# Patient Record
Sex: Female | Born: 1970 | Race: Asian | Hispanic: No | Marital: Married | Smoking: Never smoker
Health system: Southern US, Community
[De-identification: ages and names within clinical notes are randomized; demographics above are authoritative.]

## PROBLEM LIST (undated history)

## (undated) HISTORY — PX: APPENDECTOMY: SHX54

## (undated) HISTORY — PX: WISDOM TOOTH EXTRACTION: SHX21

---

## 2019-12-28 ENCOUNTER — Other Ambulatory Visit (HOSPITAL_COMMUNITY): Payer: Self-pay | Admitting: Internal Medicine

## 2019-12-28 DIAGNOSIS — Z1231 Encounter for screening mammogram for malignant neoplasm of breast: Secondary | ICD-10-CM

## 2019-12-30 ENCOUNTER — Encounter: Payer: Self-pay | Admitting: Internal Medicine

## 2019-12-31 ENCOUNTER — Other Ambulatory Visit: Payer: Self-pay

## 2019-12-31 ENCOUNTER — Ambulatory Visit (HOSPITAL_COMMUNITY)
Admission: RE | Admit: 2019-12-31 | Discharge: 2019-12-31 | Disposition: A | Discharge status: Home / Self Care | Payer: BC Managed Care – PPO | Source: Ambulatory Visit | Attending: Internal Medicine | Admitting: Internal Medicine

## 2019-12-31 DIAGNOSIS — Z1231 Encounter for screening mammogram for malignant neoplasm of breast: Secondary | ICD-10-CM | POA: Insufficient documentation

## 2020-01-05 ENCOUNTER — Other Ambulatory Visit (HOSPITAL_COMMUNITY): Payer: Self-pay | Admitting: Internal Medicine

## 2020-01-05 ENCOUNTER — Other Ambulatory Visit: Payer: Self-pay | Admitting: Internal Medicine

## 2020-01-05 DIAGNOSIS — R1900 Intra-abdominal and pelvic swelling, mass and lump, unspecified site: Secondary | ICD-10-CM

## 2020-01-11 ENCOUNTER — Ambulatory Visit (HOSPITAL_COMMUNITY)
Admission: RE | Admit: 2020-01-11 | Discharge: 2020-01-11 | Disposition: A | Discharge status: Home / Self Care | Payer: BC Managed Care – PPO | Source: Ambulatory Visit | Attending: Internal Medicine | Admitting: Internal Medicine

## 2020-01-11 ENCOUNTER — Other Ambulatory Visit: Payer: Self-pay

## 2020-01-11 DIAGNOSIS — R1900 Intra-abdominal and pelvic swelling, mass and lump, unspecified site: Secondary | ICD-10-CM

## 2020-02-17 ENCOUNTER — Other Ambulatory Visit: Payer: Self-pay

## 2020-02-17 ENCOUNTER — Ambulatory Visit (INDEPENDENT_AMBULATORY_CARE_PROVIDER_SITE_OTHER): Payer: BC Managed Care – PPO | Admitting: *Deleted

## 2020-02-17 VITALS — Ht 60.0 in | Wt 113.2 lb

## 2020-02-17 DIAGNOSIS — Z1211 Encounter for screening for malignant neoplasm of colon: Secondary | ICD-10-CM

## 2020-02-17 MED ORDER — NA SULFATE-K SULFATE-MG SULF 17.5-3.13-1.6 GM/177ML PO SOLN: 1.0000 | Freq: Once | ORAL | 0 refills | Status: AC

## 2020-02-17 NOTE — Progress Notes (Addendum)
Gastroenterology Pre-Procedure Review  Request Date: 02/17/2020 Requesting Physician: Dr. Margo Aye, no previous TCS  PATIENT REVIEW QUESTIONS: The patient responded to the following health history questions as indicated:    1. Diabetes Melitis: no 2. Joint replacements in the past 12 months: no 3. Major health problems in the past 3 months: no 4. Has an artificial valve or MVP: no 5. Has a defibrillator: no 6. Has been advised in past to take antibiotics in advance of a procedure like teeth cleaning: no 7. Family history of colon cancer: no  8. Alcohol Use: no 9. Illicit drug Use: no 10. History of sleep apnea: no  11. History of coronary artery or other vascular stents placed within the last 12 months: no 12. History of any prior anesthesia complications: no 13. Body mass index is 22.11 kg/m.    MEDICATIONS & ALLERGIES:    Patient reports the following regarding taking any blood thinners:   Plavix? no Aspirin? no Coumadin? no Brilinta? no Xarelto? no Eliquis? no Pradaxa? no Savaysa? no Effient? no  Patient confirms/reports the following medications:  No current outpatient medications on file.   No current facility-administered medications for this visit.    Patient confirms/reports the following allergies:  No Known Allergies  No orders of the defined types were placed in this encounter.   AUTHORIZATION INFORMATION Primary Insurance: BCBS,  ID #:[id number] ,  Group #:[id number] Pre-Cert / Auth required: No, file to local [employer]  SCHEDULE INFORMATION: Procedure has been scheduled as follows:  Date: 03/10/2020, Time: 12:15  Location: [facility] with Dr. Marletta Lor  This Gastroenterology Pre-Precedure Review Form is being routed to the following provider(s): Ermalinda Memos, PA

## 2020-02-17 NOTE — Patient Instructions (Signed)
Kara Suarez  10-14-1970 MRN: [id number]     Procedure Date: 03/10/2020 Time to register: 10:45 Place to register: [facility] Procedure Time: 12:15 pm Scheduled provider: Dr. Abbey Chatters    PREPARATION FOR COLONOSCOPY WITH SUPREP BOWEL PREP KIT  Note: Suprep Bowel Prep Kit is a split-dose (2day) regimen. Consumption of BOTH 6-ounce bottles is required for a complete prep.  Please notify us immediately if you are diabetic, take iron supplements, or if you are on Coumadin or any other blood thinners.  Please hold the following medications: n/a                                                                                                                                                  2 DAYS BEFORE PROCEDURE:  DATE: 03/08/2020   DAY: Wednesday Begin clear liquid diet AFTER your lunch meal. NO SOLID FOODS after this point.  1 DAY BEFORE PROCEDURE:  DATE: 03/09/2020   DAY: Thursday Continue clear liquids the entire day - NO SOLID FOOD.   Diabetic medications adjustments for today: n/a  At 6:00pm: Complete steps 1 through 4 below, using ONE (1) 6-ounce bottle, before going to bed. Step 1:  Pour ONE (1) 6-ounce bottle of SUPREP liquid into the mixing container.  Step 2:  Add cool drinking water to the 16 ounce line on the container and mix.  Note: Dilute the solution concentrate as directed prior to use. Step 3:  DRINK ALL the liquid in the container. Step 4:  You MUST drink an additional two (2) or more 16 ounce containers of water over the next one (1) hour.   Continue clear liquids.  DAY OF PROCEDURE:   DATE: 03/10/2020   DAY: Friday If you take medications for your heart, blood pressure, or breathing, you may take these medications.  Diabetic medications adjustments for today: n/a  5 hours before your procedure at : 7:15 am Step 1:  Pour ONE (1) 6-ounce bottle of SUPREP liquid into the mixing container.  Step 2:  Add cool drinking water to the 16 ounce line on the  container and mix.  Note: Dilute the solution concentrate as directed prior to use. Step 3:  DRINK ALL the liquid in the container. Step 4:  You MUST drink an additional two (2) or more 16 ounce containers of water over the next one (1) hour. You MUST complete the final glass of water at least 3 hours before your colonoscopy. Nothing by mouth past 9:15 am.  You may take your morning medications with sip of water unless we have instructed otherwise.    Please see below for Dietary Information.  CLEAR LIQUIDS INCLUDE:  Water Jello (NOT red in color)   Ice Popsicles (NOT red in color)   Tea (sugar ok, no milk/cream) Powdered fruit flavored drinks  Coffee (sugar ok, no milk/cream)  Gatorade/ Lemonade/ Kool-Aid  (NOT red in color)   Juice: apple, white grape, white cranberry Soft drinks  Clear bullion, consomme, broth (fat free beef/chicken/vegetable)  Carbonated beverages (any kind)  Strained chicken noodle soup Hard Candy   Remember: Clear liquids are liquids that will allow you to see your fingers on the other side of a clear glass. Be sure liquids are NOT red in color, and not cloudy, but CLEAR.  DO NOT EAT OR DRINK ANY OF THE FOLLOWING:  Dairy products of any kind   Cranberry juice Tomato juice / V8 juice   Grapefruit juice Orange juice     Red grape juice  Do not eat any solid foods, including such foods as: cereal, oatmeal, yogurt, fruits, vegetables, creamed soups, eggs, bread, crackers, pureed foods in a blender, etc.   HELPFUL HINTS FOR DRINKING PREP SOLUTION:   Make sure prep is extremely cold. Mix and refrigerate the the morning of the prep. You may also put in the freezer.   You may try mixing some Crystal Light or Country Time Lemonade if you prefer. Mix in small amounts; add more if necessary.  Try drinking through a straw  Rinse mouth with water or a mouthwash between glasses, to remove after-taste.  Try sipping on a cold beverage /ice/ popsicles between glasses of  prep.  Place a piece of sugar-free hard candy in mouth between glasses.  If you become nauseated, try consuming smaller amounts, or stretch out the time between glasses. Stop for 30-60 minutes, then slowly start back drinking.     OTHER INSTRUCTIONS  You will need a responsible adult at least 50 years of age to accompany you and drive you home. This person must remain in the waiting room during your procedure. The hospital will cancel your procedure if you do not have a responsible adult with you.   1. Wear loose fitting clothing that is easily removed. 2. Leave jewelry and other valuables at home.  3. Remove all body piercing jewelry and leave at home. 4. Total time from sign-in until discharge is approximately 2-3 hours. 5. You should go home directly after your procedure and rest. You can resume normal activities the day after your procedure. 6. The day of your procedure you should not:  Drive  Make legal decisions  Operate machinery  Drink alcohol  Return to work   You may call the office (Dept: [phone]) before 5:00pm, or page the doctor on call [phone]) after 5:00pm, for further instructions, if necessary.   Insurance Information YOU WILL NEED TO CHECK WITH YOUR INSURANCE COMPANY FOR THE BENEFITS OF COVERAGE YOU HAVE FOR THIS PROCEDURE.  UNFORTUNATELY, NOT ALL INSURANCE COMPANIES HAVE BENEFITS TO COVER ALL OR PART OF THESE TYPES OF PROCEDURES.  IT IS YOUR RESPONSIBILITY TO CHECK YOUR BENEFITS, HOWEVER, WE WILL BE GLAD TO ASSIST YOU WITH ANY CODES YOUR INSURANCE COMPANY MAY NEED.    PLEASE NOTE THAT MOST INSURANCE COMPANIES WILL NOT COVER A SCREENING COLONOSCOPY FOR PEOPLE UNDER THE AGE OF 50  IF YOU HAVE BCBS INSURANCE, YOU MAY HAVE BENEFITS FOR A SCREENING COLONOSCOPY BUT IF POLYPS ARE FOUND THE DIAGNOSIS WILL CHANGE AND THEN YOU MAY HAVE A DEDUCTIBLE THAT WILL NEED TO BE MET. SO PLEASE MAKE SURE YOU CHECK YOUR BENEFITS FOR A SCREENING COLONOSCOPY AS WELL AS A  DIAGNOSTIC COLONOSCOPY.

## 2020-02-18 ENCOUNTER — Telehealth: Payer: Self-pay | Admitting: *Deleted

## 2020-02-18 NOTE — Telephone Encounter (Signed)
Pt wants to reschedule her procedure. [phone]

## 2020-02-18 NOTE — Progress Notes (Signed)
Ok to schedule.  ASA I/II 

## 2020-02-23 NOTE — Telephone Encounter (Signed)
Spoke with pt and she said that she could keep her regularly scheduled procedure as planned.

## 2020-03-06 ENCOUNTER — Encounter (HOSPITAL_COMMUNITY): Payer: Self-pay | Admitting: *Deleted

## 2020-03-08 ENCOUNTER — Other Ambulatory Visit (HOSPITAL_COMMUNITY)
Admission: RE | Admit: 2020-03-08 | Discharge: 2020-03-08 | Disposition: A | Discharge status: Home / Self Care | Payer: BC Managed Care – PPO | Source: Ambulatory Visit | Attending: Internal Medicine | Admitting: Internal Medicine

## 2020-03-08 ENCOUNTER — Other Ambulatory Visit: Payer: Self-pay

## 2020-03-08 DIAGNOSIS — K648 Other hemorrhoids: Secondary | ICD-10-CM | POA: Diagnosis not present

## 2020-03-08 DIAGNOSIS — Z01812 Encounter for preprocedural laboratory examination: Secondary | ICD-10-CM | POA: Insufficient documentation

## 2020-03-08 DIAGNOSIS — Z1211 Encounter for screening for malignant neoplasm of colon: Secondary | ICD-10-CM | POA: Diagnosis present

## 2020-03-08 DIAGNOSIS — Z20822 Contact with and (suspected) exposure to covid-19: Secondary | ICD-10-CM | POA: Diagnosis not present

## 2020-03-08 LAB — PREGNANCY, URINE: Preg Test, Ur: NEGATIVE

## 2020-03-09 LAB — SARS CORONAVIRUS 2 (TAT 6-24 HRS): SARS Coronavirus 2: NEGATIVE

## 2020-03-10 ENCOUNTER — Ambulatory Visit (HOSPITAL_COMMUNITY): Payer: BC Managed Care – PPO | Admitting: Anesthesiology

## 2020-03-10 ENCOUNTER — Ambulatory Visit (HOSPITAL_COMMUNITY)
Admission: RE | Admit: 2020-03-10 | Discharge: 2020-03-10 | Disposition: A | Discharge status: Home / Self Care | Payer: BC Managed Care – PPO | Attending: Internal Medicine | Admitting: Internal Medicine

## 2020-03-10 ENCOUNTER — Encounter (HOSPITAL_COMMUNITY): Payer: Self-pay

## 2020-03-10 ENCOUNTER — Other Ambulatory Visit: Payer: Self-pay

## 2020-03-10 ENCOUNTER — Encounter (HOSPITAL_COMMUNITY)
Admission: RE | Disposition: A | Discharge status: Home / Self Care | Payer: Self-pay | Source: Home / Self Care | Attending: Internal Medicine

## 2020-03-10 DIAGNOSIS — Z1211 Encounter for screening for malignant neoplasm of colon: Secondary | ICD-10-CM | POA: Diagnosis not present

## 2020-03-10 DIAGNOSIS — Z20822 Contact with and (suspected) exposure to covid-19: Secondary | ICD-10-CM | POA: Insufficient documentation

## 2020-03-10 DIAGNOSIS — K648 Other hemorrhoids: Secondary | ICD-10-CM | POA: Insufficient documentation

## 2020-03-10 HISTORY — PX: COLONOSCOPY WITH PROPOFOL: SHX5780

## 2020-03-10 SURGERY — COLONOSCOPY WITH PROPOFOL
Anesthesia: General

## 2020-03-10 MED ORDER — CHLORHEXIDINE GLUCONATE CLOTH 2 % EX PADS: 6.0000 | MEDICATED_PAD | Freq: Once | CUTANEOUS | Status: DC

## 2020-03-10 MED ORDER — STERILE WATER FOR IRRIGATION IR SOLN
Status: DC | PRN
  Administered 2020-03-10: 100 mL

## 2020-03-10 MED ORDER — PROPOFOL 10 MG/ML IV BOLUS
INTRAVENOUS | Status: DC | PRN
  Administered 2020-03-10: 80 mg via INTRAVENOUS
  Administered 2020-03-10: 150 ug/kg/min via INTRAVENOUS

## 2020-03-10 MED ORDER — LACTATED RINGERS IV SOLN: INTRAVENOUS | Status: DC | PRN

## 2020-03-10 MED ORDER — LACTATED RINGERS IV SOLN: Freq: Once | INTRAVENOUS | Status: AC

## 2020-03-10 NOTE — Anesthesia Postprocedure Evaluation (Signed)
Anesthesia Post Note  Patient: Kara Suarez  Procedure(s) Performed: COLONOSCOPY WITH PROPOFOL (N/A )  Patient location during evaluation: Endoscopy Anesthesia Type: General Level of consciousness: awake and alert and oriented Pain management: pain level controlled Respiratory status: spontaneous breathing, nonlabored ventilation and respiratory function stable Cardiovascular status: blood pressure returned to baseline and stable Postop Assessment: no apparent nausea or vomiting Anesthetic complications: no   No complications documented.   Last Vitals:  Vitals:   03/10/20 1152 03/10/20 1157  BP: 91/69 90/62  Pulse: 92 75  Resp: (!) 24 20  Temp: 36.5 C   SpO2: 98% 99%    Last Pain:  Vitals:   03/10/20 1202  TempSrc:   PainSc: 0-No pain                 Julian Reil

## 2020-03-10 NOTE — Transfer of Care (Signed)
Immediate Anesthesia Transfer of Care Note  Patient: Mita Ozaki  Procedure(s) Performed: COLONOSCOPY WITH PROPOFOL (N/A )  Patient Location: PACU  Anesthesia Type:General  Level of Consciousness: awake, alert , oriented and patient cooperative  Airway & Oxygen Therapy: Patient Spontanous Breathing  Post-op Assessment: Report given to RN, Post -op Vital signs reviewed and stable and Patient moving all extremities  Post vital signs: Reviewed and stable  Last Vitals:  Vitals Value Taken Time  BP    Temp    Pulse    Resp    SpO2      Last Pain:  Vitals:   03/10/20 1134  TempSrc:   PainSc: 0-No pain         Complications: No complications documented.

## 2020-03-10 NOTE — Discharge Instructions (Signed)
  Colonoscopy Discharge Instructions  Read the instructions outlined below and refer to this sheet in the next few weeks. These discharge instructions provide you with general information on caring for yourself after you leave the hospital. Your doctor may also give you specific instructions. While your treatment has been planned according to the most current medical practices available, unavoidable complications occasionally occur.   ACTIVITY  You may resume your regular activity, but move at a slower pace for the next 24 hours.   Take frequent rest periods for the next 24 hours.   Walking will help get rid of the air and reduce the bloated feeling in your belly (abdomen).   No driving for 24 hours (because of the medicine (anesthesia) used during the test).    Do not sign any important legal documents or operate any machinery for 24 hours (because of the anesthesia used during the test).  NUTRITION  Drink plenty of fluids.   You may resume your normal diet as instructed by your doctor.   Begin with a light meal and progress to your normal diet. Heavy or fried foods are harder to digest and may make you feel sick to your stomach (nauseated).   Avoid alcoholic beverages for 24 hours or as instructed.  MEDICATIONS  You may resume your normal medications unless your doctor tells you otherwise.  WHAT YOU CAN EXPECT TODAY  Some feelings of bloating in the abdomen.   Passage of more gas than usual.   Spotting of blood in your stool or on the toilet paper.  IF YOU HAD POLYPS REMOVED DURING THE COLONOSCOPY:  No aspirin products for 7 days or as instructed.   No alcohol for 7 days or as instructed.   Eat a soft diet for the next 24 hours.  FINDING OUT THE RESULTS OF YOUR TEST Not all test results are available during your visit. If your test results are not back during the visit, make an appointment with your caregiver to find out the results. Do not assume everything is normal if  you have not heard from your caregiver or the medical facility. It is important for you to follow up on all of your test results.  SEEK IMMEDIATE MEDICAL ATTENTION IF:  You have more than a spotting of blood in your stool.   Your belly is swollen (abdominal distention).   You are nauseated or vomiting.   You have a temperature over 101.   You have abdominal pain or discomfort that is severe or gets worse throughout the day.   Your colonoscopy was unremarkable.  I did not find a polyps or evidence of colon cancer.  I would recommend we repeat colonoscopy in 10 years for screening purposes.  Follow-up with GI as needed.  I hope you have a great rest of your week!  Hennie Duos. Marletta Lor, D.O. Gastroenterology and Hepatology [facility]

## 2020-03-10 NOTE — Anesthesia Preprocedure Evaluation (Addendum)
Anesthesia Evaluation  Patient identified by MRN, date of birth, ID band Patient awake    Reviewed: Allergy & Precautions, NPO status , Patient's Chart, lab work & pertinent test results  History of Anesthesia Complications Negative for: history of anesthetic complications  Airway Mallampati: III  TM Distance: >3 FB Neck ROM: Full    Dental  (+) Dental Advisory Given, Implants   Pulmonary neg sleep apnea (snoring),  Snoring    Pulmonary exam normal breath sounds clear to auscultation       Cardiovascular Exercise Tolerance: Good negative cardio ROS Normal cardiovascular exam Rhythm:Regular Rate:Normal     Neuro/Psych negative neurological ROS  negative psych ROS   GI/Hepatic negative GI ROS, Neg liver ROS,   Endo/Other  negative endocrine ROS  Renal/GU negative Renal ROS     Musculoskeletal negative musculoskeletal ROS (+)   Abdominal   Peds  Hematology negative hematology ROS (+)   Anesthesia Other Findings   Reproductive/Obstetrics negative OB ROS                            Anesthesia Physical Anesthesia Plan  ASA: I  Anesthesia Plan: General   Post-op Pain Management:    Induction: Intravenous  PONV Risk Score and Plan: TIVA  Airway Management Planned: Nasal Cannula and Natural Airway  Additional Equipment:   Intra-op Plan:   Post-operative Plan:   Informed Consent: I have reviewed the patients History and Physical, chart, labs and discussed the procedure including the risks, benefits and alternatives for the proposed anesthesia with the patient or authorized representative who has indicated his/her understanding and acceptance.     Dental advisory given  Plan Discussed with: CRNA and Surgeon  Anesthesia Plan Comments:       Anesthesia Quick Evaluation

## 2020-03-10 NOTE — Anesthesia Postprocedure Evaluation (Signed)
Anesthesia Post Note  Patient: Kara Suarez  Procedure(s) Performed: COLONOSCOPY WITH PROPOFOL (N/A )  Patient location during evaluation: PACU Anesthesia Type: General Level of consciousness: awake, oriented, awake and alert and patient cooperative Pain management: pain level controlled Vital Signs Assessment: post-procedure vital signs reviewed and stable Respiratory status: spontaneous breathing, respiratory function stable and nonlabored ventilation Cardiovascular status: blood pressure returned to baseline and stable Postop Assessment: no headache and no backache Anesthetic complications: no   No complications documented.   Last Vitals:  Vitals:   03/10/20 1041  BP: 99/65  Pulse: 76  Resp: (!) 22  Temp: 36.6 C    Last Pain:  Vitals:   03/10/20 1134  TempSrc:   PainSc: 0-No pain                 Brynda Peon

## 2020-03-10 NOTE — H&P (Signed)
Primary Care Physician:  Benita Stabile, MD Primary Gastroenterologist:  Dr. Marletta Lor  Pre-Procedure History & Physical: HPI:  Kara Suarez is a 49 y.o. female is here for a colonoscopy for colon cancer screening purposes.  Patient denies any family history of colorectal cancer.  No melena or hematochezia.  No abdominal pain or unintentional weight loss.  No change in bowel habits.  Overall feels well from a GI standpoint.  History reviewed. No pertinent past medical history.  Past Surgical History:  Procedure Laterality Date  . APPENDECTOMY    . CESAREAN SECTION    . WISDOM TOOTH EXTRACTION      Prior to Admission medications   Medication Sig Start Date End Date Taking? Authorizing Provider  ascorbic acid (VITAMIN C) 500 MG tablet Take 500 mg by mouth daily.    [provider]    Allergies as of 02/23/2020  . (No Known Allergies)    History reviewed. No pertinent family history.  Social History   Socioeconomic History  . Marital status: Married    Spouse name: Not on file  . Number of children: Not on file  . Years of education: Not on file  . Highest education level: Not on file  Occupational History  . Not on file  Tobacco Use  . Smoking status: Never Smoker  . Smokeless tobacco: Never Used  Vaping Use  . Vaping Use: Never used  Substance and Sexual Activity  . Alcohol use: Never  . Drug use: Never  . Sexual activity: Not on file  Other Topics Concern  . Not on file  Social History Narrative  . Not on file   Social Determinants of Health   Financial Resource Strain:   . Difficulty of Paying Living Expenses: Not on file  Food Insecurity:   . Worried About Programme researcher, broadcasting/film/video in the Last Year: Not on file  . Ran Out of Food in the Last Year: Not on file  Transportation Needs:   . Lack of Transportation (Medical): Not on file  . Lack of Transportation (Non-Medical): Not on file  Physical Activity:   . Days of Exercise per Week: Not on file  . Minutes of  Exercise per Session: Not on file  Stress:   . Feeling of Stress : Not on file  Social Connections:   . Frequency of Communication with Friends and Family: Not on file  . Frequency of Social Gatherings with Friends and Family: Not on file  . Attends Religious Services: Not on file  . Active Member of Clubs or Organizations: Not on file  . Attends Banker Meetings: Not on file  . Marital Status: Not on file  Intimate Partner Violence:   . Fear of Current or Ex-Partner: Not on file  . Emotionally Abused: Not on file  . Physically Abused: Not on file  . Sexually Abused: Not on file    Review of Systems: See HPI, otherwise negative ROS  Impression/Plan: Kara Suarez is here for a colonoscopy to be performed for colon cancer screening purposes.  The risks of the procedure including infection, bleed, or perforation as well as benefits, limitations, alternatives and imponderables have been reviewed with the patient. Questions have been answered. All parties agreeable.

## 2020-03-10 NOTE — Op Note (Signed)
[facility] Patient Name: Jannelle Notaro Procedure Date: 03/10/2020 11:25 AM MRN: [id number] Date of Birth: 03/21/1971 Attending MD: Elon Alas. Abbey Chatters DO CSN: [id number] Age: 49 Admit Type: Outpatient Procedure:                Colonoscopy Indications:              Screening for colorectal malignant neoplasm Providers:                Elon Alas. Benedetto Ryder, DO, Otis Peak B. Sharon Seller, RN,                            Randa Spike, Technician Referring MD:              Medicines:                See the Anesthesia note for documentation of the                            administered medications Complications:            No immediate complications. Estimated Blood Loss:     Estimated blood loss: none. Procedure:                Pre-Anesthesia Assessment:                           - The anesthesia plan was to use monitored                            anesthesia care (MAC).                           After obtaining informed consent, the colonoscope                            was passed under direct vision. Throughout the                            procedure, the patient's blood pressure, pulse, and                            oxygen saturations were monitored continuously. The                            [id number] ([id number]) was introduced through the                            anus and advanced to the the terminal ileum, with                            identification of the appendiceal orifice and IC                            valve. The colonoscopy was performed without                            difficulty. The patient tolerated the  procedure                            well. The quality of the bowel preparation was                            evaluated using the BBPS [facility] Bowel Preparation                            Scale) with scores of: Right Colon = 3, Transverse                            Colon = 3 and Left Colon = 3 (entire mucosa seen                            well with no residual  staining, small fragments of                            stool or opaque liquid). The total BBPS score                            equals 9. Scope In: 11:37:28 AM Scope Out: 11:49:06 AM Scope Withdrawal Time: 0 hours 6 minutes 13 seconds  Total Procedure Duration: 0 hours 11 minutes 38 seconds  Findings:      The perianal and digital rectal examinations were normal.      Non-bleeding internal hemorrhoids were found during retroflexion.      The terminal ileum appeared normal.      The entire examined colon appeared normal. Impression:               - Non-bleeding internal hemorrhoids.                           - The examined portion of the ileum was normal.                           - The entire examined colon is normal.                           - No specimens collected. Moderate Sedation:      Per Anesthesia Care Recommendation:           - Patient has a contact number available for                            emergencies. The signs and symptoms of potential                            delayed complications were discussed with the                            patient. Return to normal activities tomorrow.                            Written discharge instructions were provided to the  patient.                           - Resume previous diet.                           - Continue present medications.                           - Repeat colonoscopy in 10 years for screening                            purposes.                           - Return to GI clinic PRN. Procedure Code(s):        --- Professional ---                           Y6415, Colorectal cancer screening; colonoscopy on                            individual not meeting criteria for high risk Diagnosis Code(s):        --- Professional ---                           Z12.11, Encounter for screening for malignant                            neoplasm of colon                           K64.8, Other  hemorrhoids CPT copyright 2019 American Medical Association. All rights reserved. The codes documented in this report are preliminary and upon coder review may  be revised to meet current compliance requirements. Elon Alas. Abbey Chatters, DO [place] Abbey Chatters, DO 03/10/2020 11:50:46 AM This report has been signed electronically. Number of Addenda: 0

## 2020-03-17 ENCOUNTER — Encounter (HOSPITAL_COMMUNITY): Payer: Self-pay | Admitting: Internal Medicine

## 2020-04-20 ENCOUNTER — Other Ambulatory Visit: Payer: BC Managed Care – PPO

## 2020-04-20 ENCOUNTER — Other Ambulatory Visit: Payer: Self-pay | Admitting: *Deleted

## 2020-04-20 DIAGNOSIS — Z20822 Contact with and (suspected) exposure to covid-19: Secondary | ICD-10-CM

## 2020-04-22 LAB — NOVEL CORONAVIRUS, NAA: SARS-CoV-2, NAA: NOT DETECTED

## 2020-04-22 LAB — SARS-COV-2, NAA 2 DAY TAT

## 2020-04-22 LAB — SPECIMEN STATUS REPORT

## 2021-09-10 IMAGING — MG DIGITAL SCREENING BILAT W/ TOMO W/ CAD
8 series · 8 of 24 positions shown · non-contrast
Comparison: None.

CLINICAL DATA: Screening.

EXAM:
DIGITAL SCREENING BILATERAL MAMMOGRAM WITH TOMO AND CAD

[L CC synth-2D]
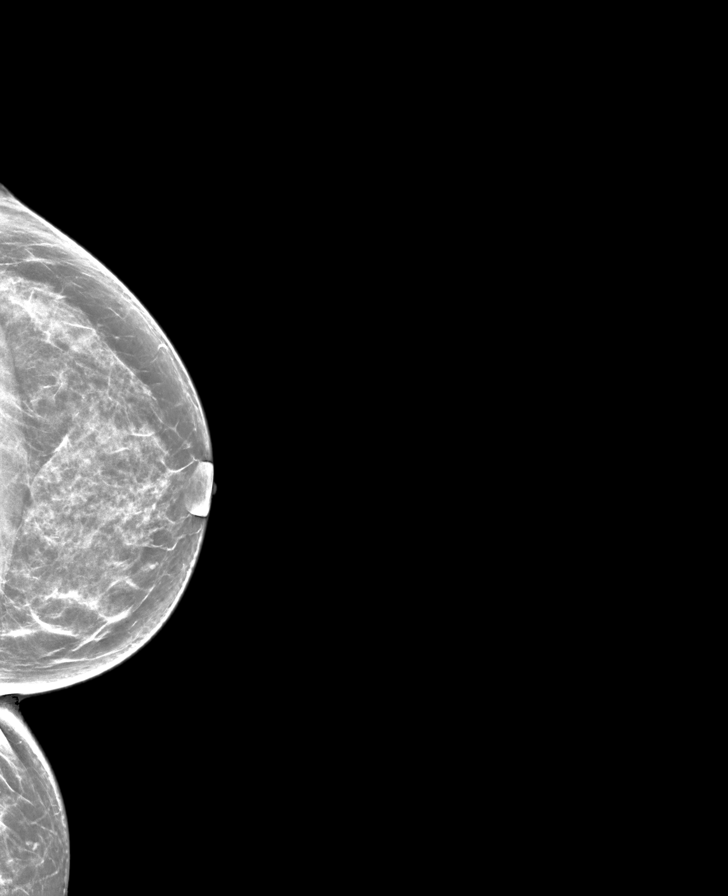

[R MLO synth-2D]
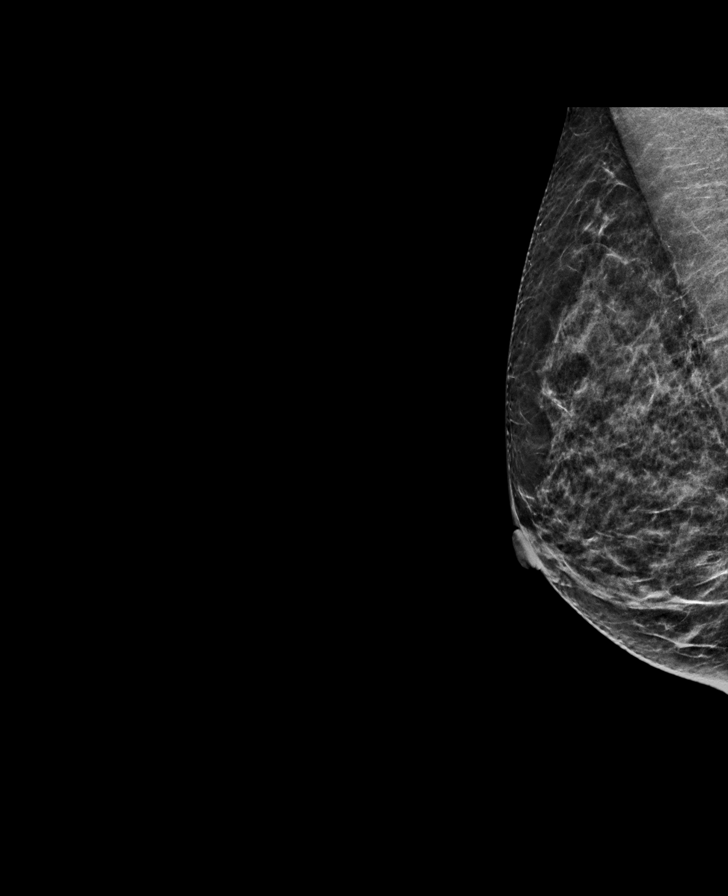

[L MLO synth-2D]
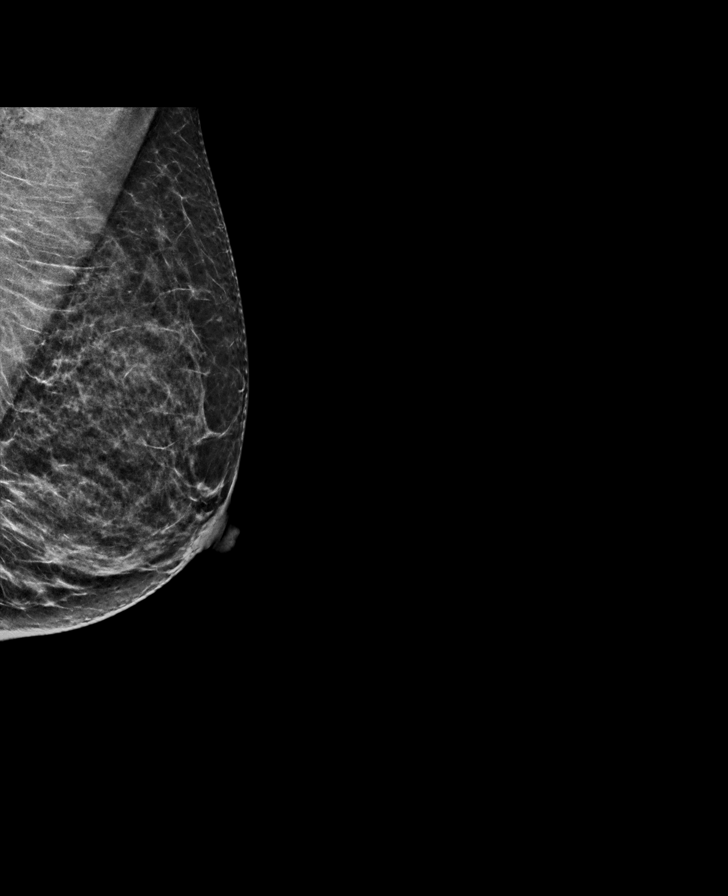

[R CC synth-2D]
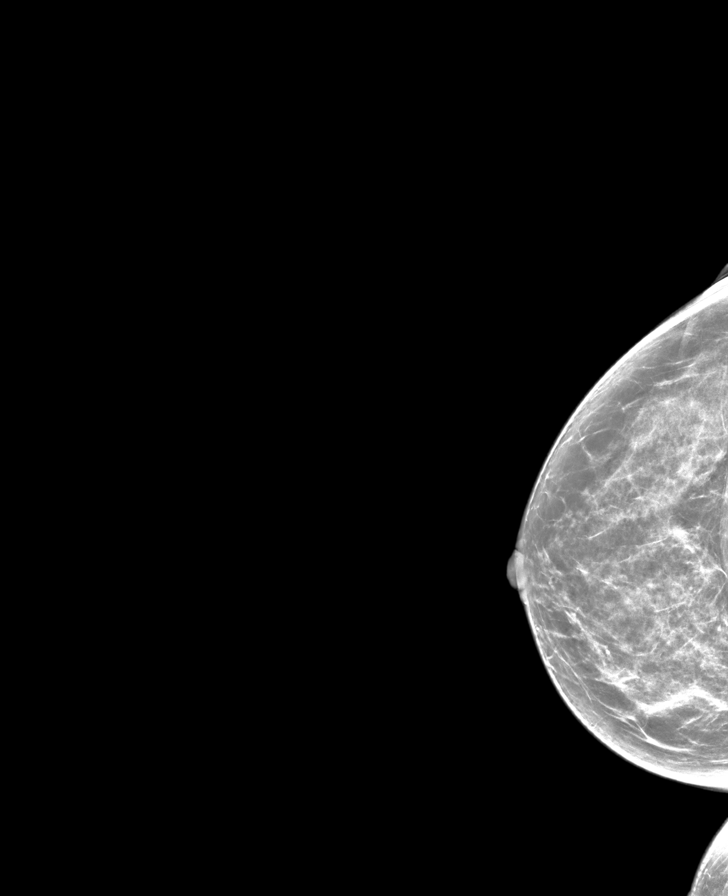

[L MLO tomo · tomo slice 28/55.0]
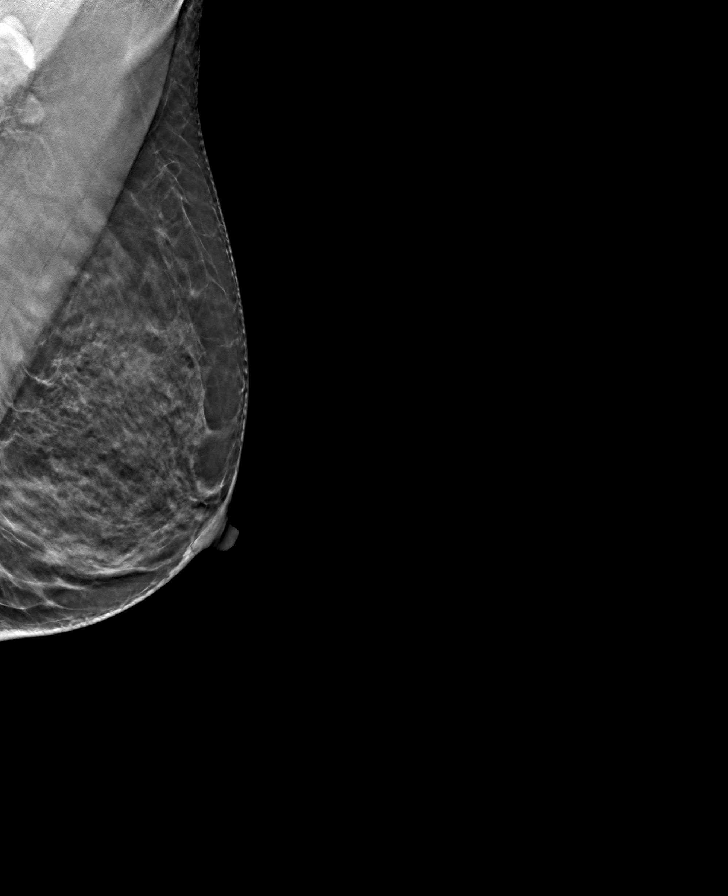

[R CC tomo · tomo slice 32/63.0]
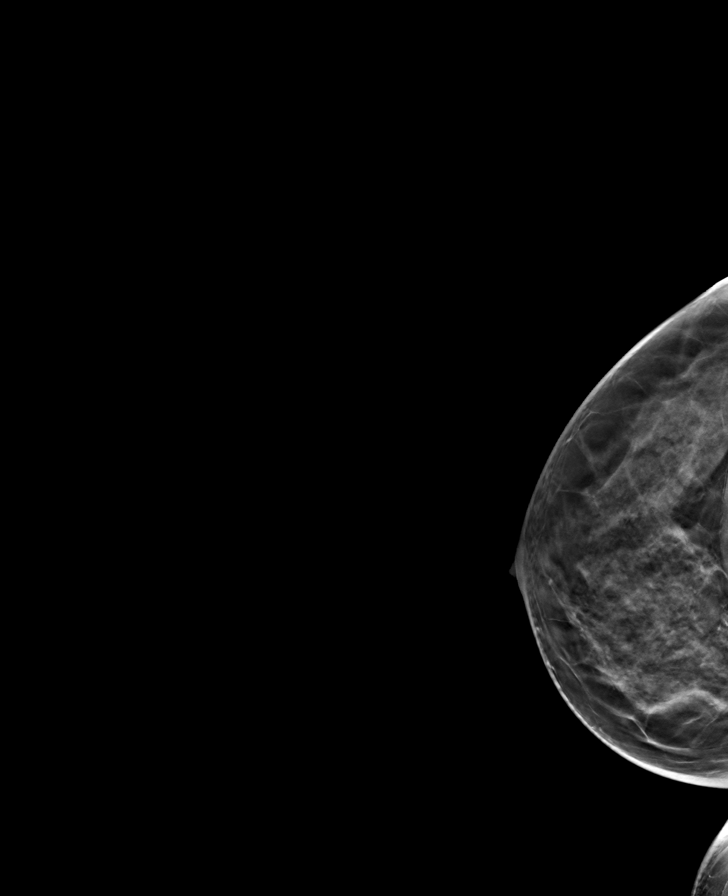

[R MLO tomo · tomo slice 26/51.0]
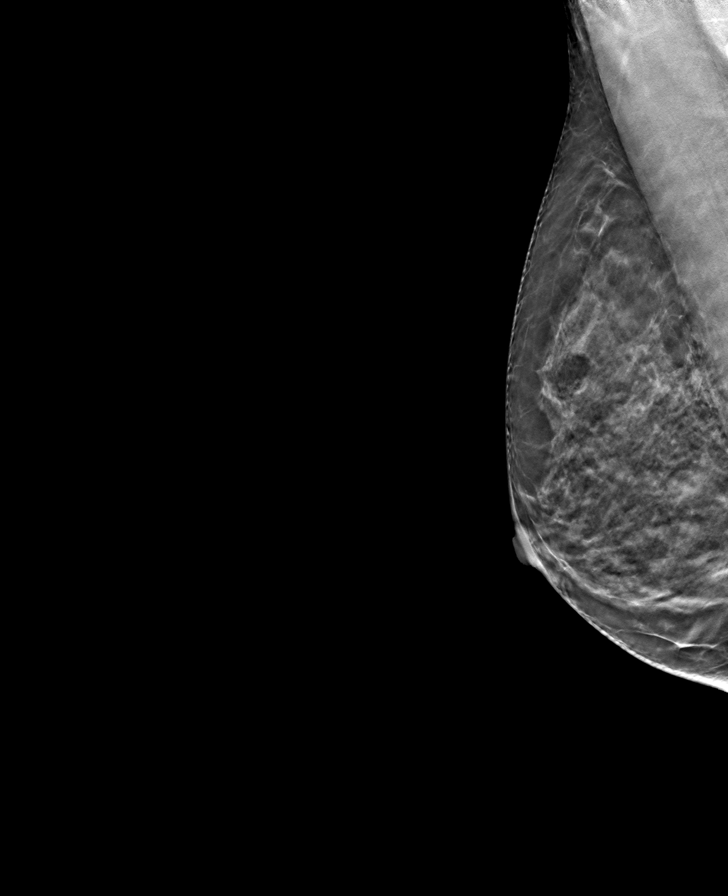

[L CC tomo · tomo slice 33/65.0]
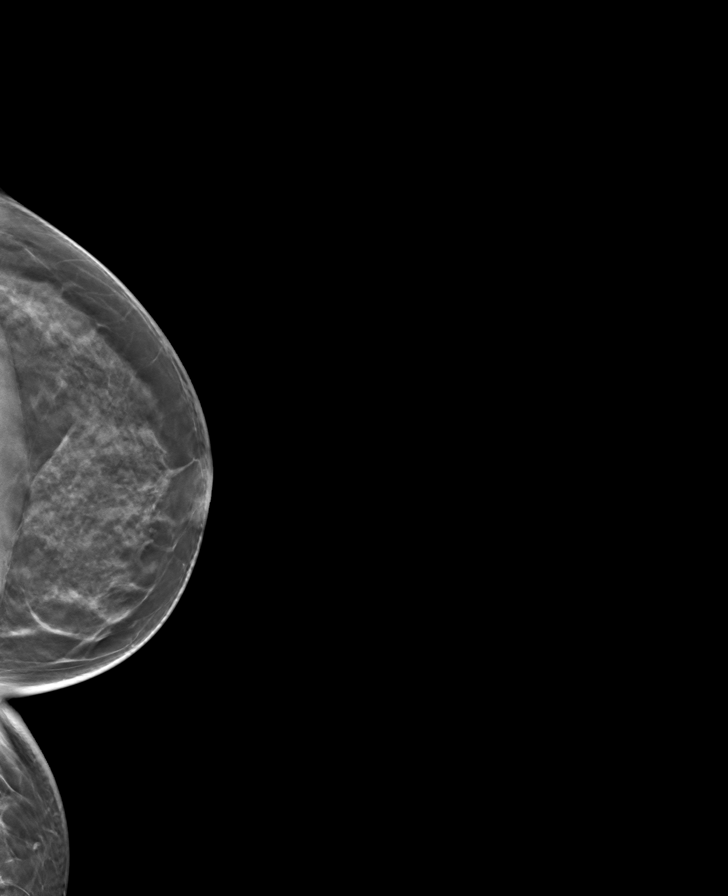

[8 of 24 positions shown; findings below may reference images not displayed]

ACR Breast Density Category c: The breast tissue is heterogeneously
dense, which may obscure small masses
FINDINGS: There are no findings suspicious for malignancy. Images were
processed with CAD.
IMPRESSION: No mammographic evidence of malignancy. A result letter of this
screening mammogram will be mailed directly to the patient.

RECOMMENDATION:
Screening mammogram in one year. (Code:EM-2-IHY)

BI-RADS CATEGORY  1: Negative.

## 2021-09-21 IMAGING — US US PELVIS COMPLETE WITH TRANSVAGINAL
1 series · 13 of 25 positions shown · non-contrast
Comparison: None

CLINICAL DATA: Bloating, intermittent BILATERAL lower quadrant pain
LEFT greater than RIGHT, LMP 12/23/2019, prior Caesarean section



[Series 1: us pelvic complete with transvaginal · 13 of 84 slices shown]
[im 1/84]
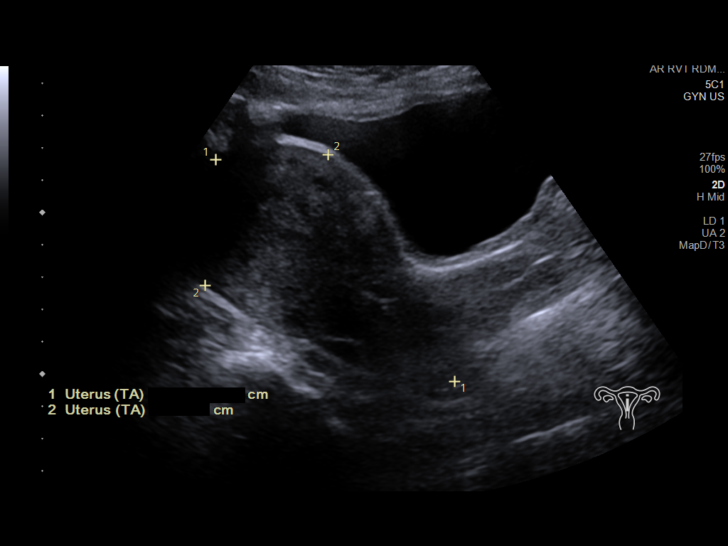
[im 7/84]
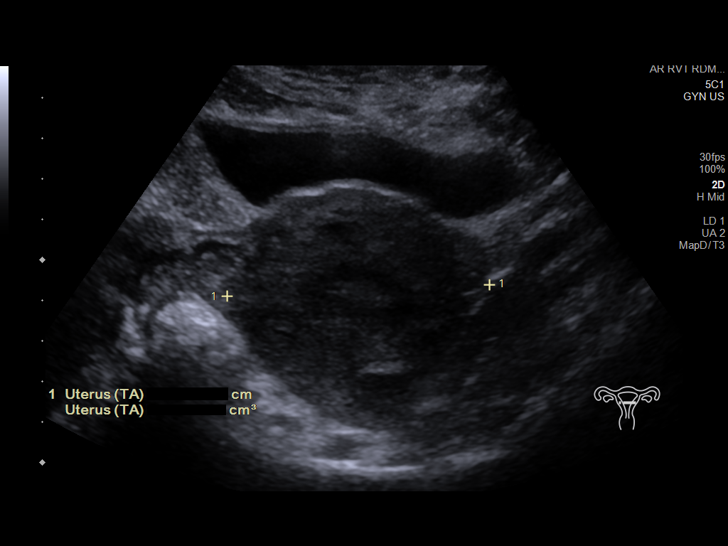
[im 14/84]
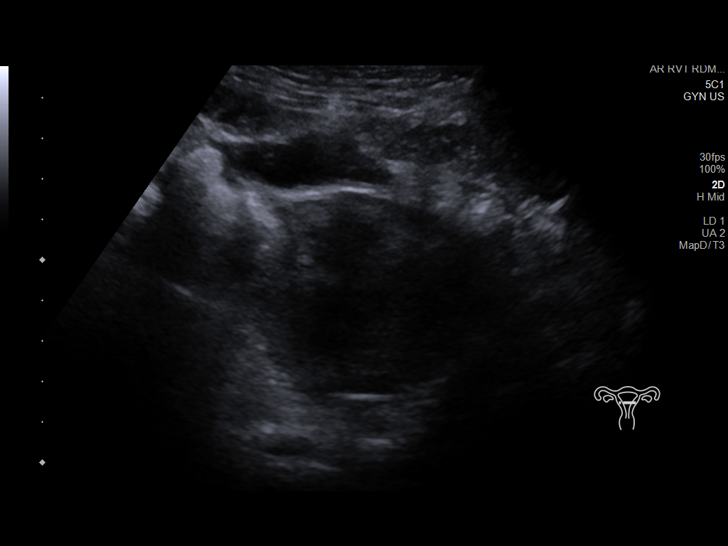
[im 21/84]
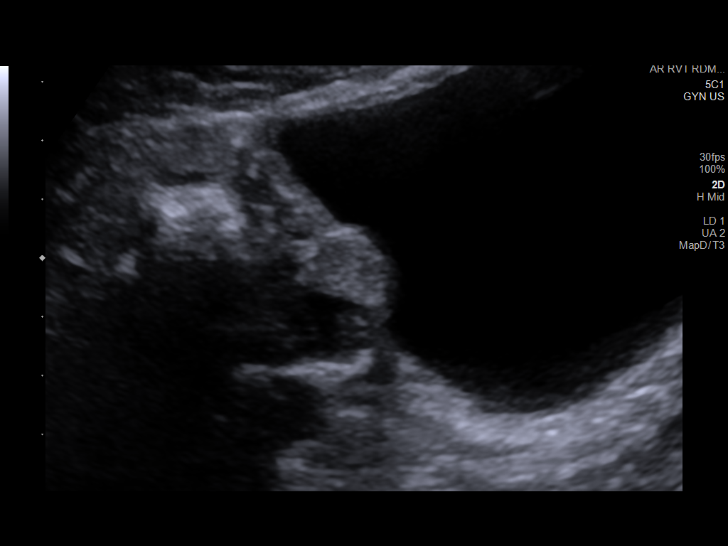
[im 28/84]
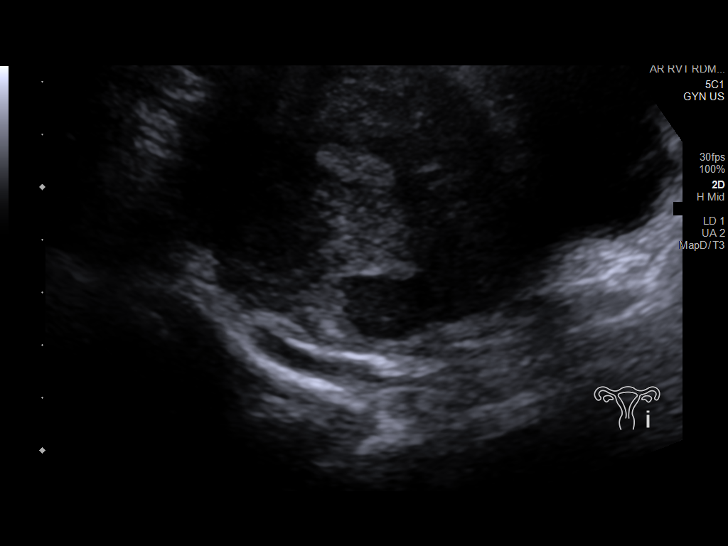
[im 35/84]
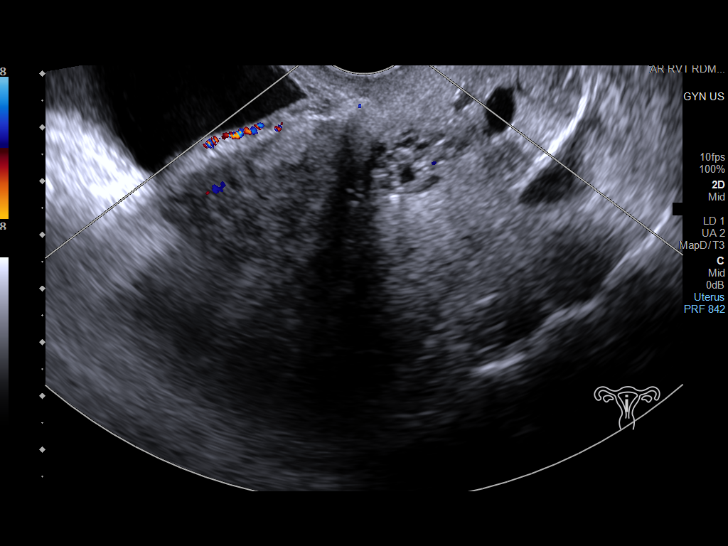
[im 42/84]
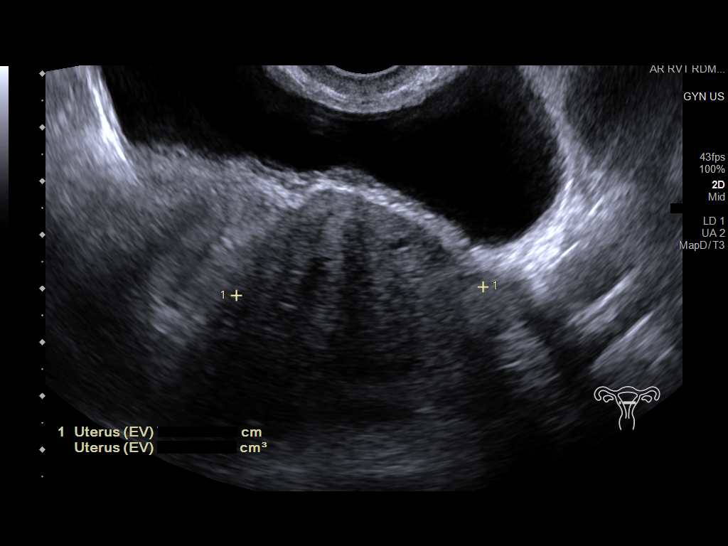
[im 49/84]
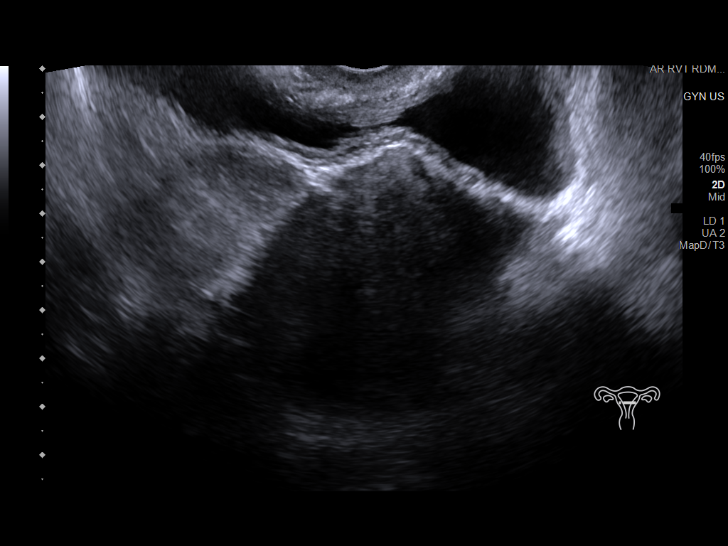
[im 56/84]
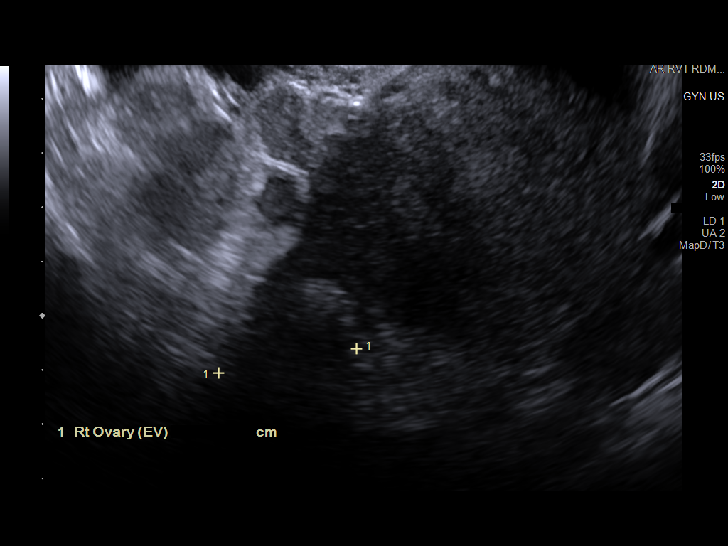
[im 63/84]
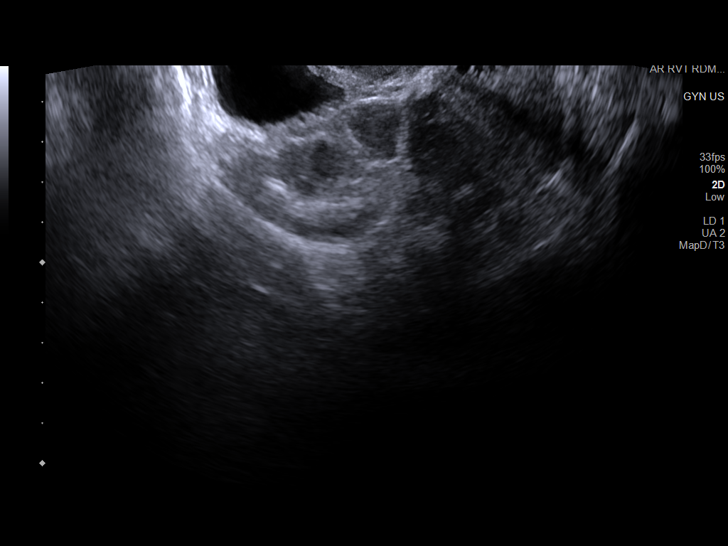
[im 70/84]
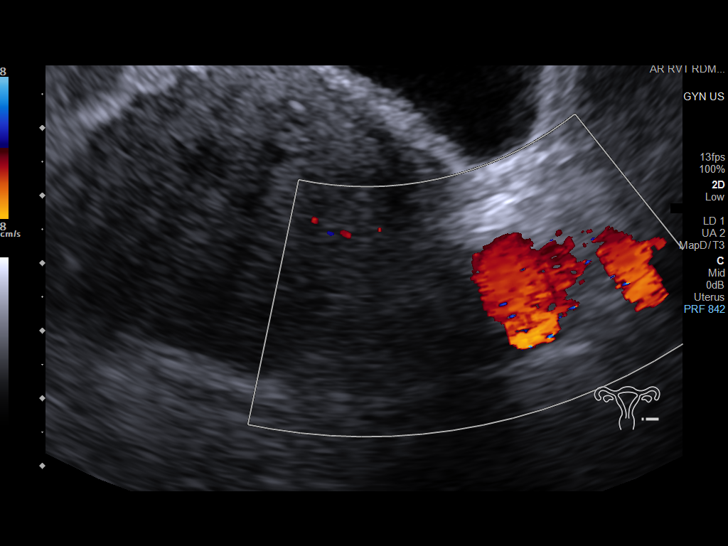
[im 77/84]
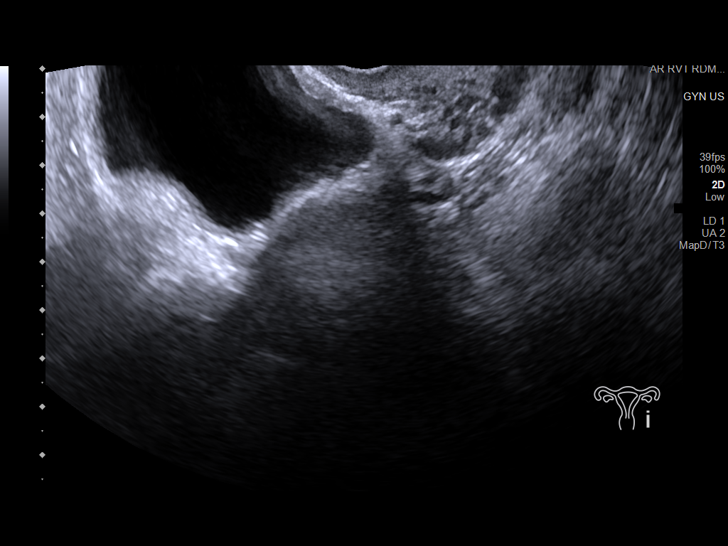
[im 84/84]
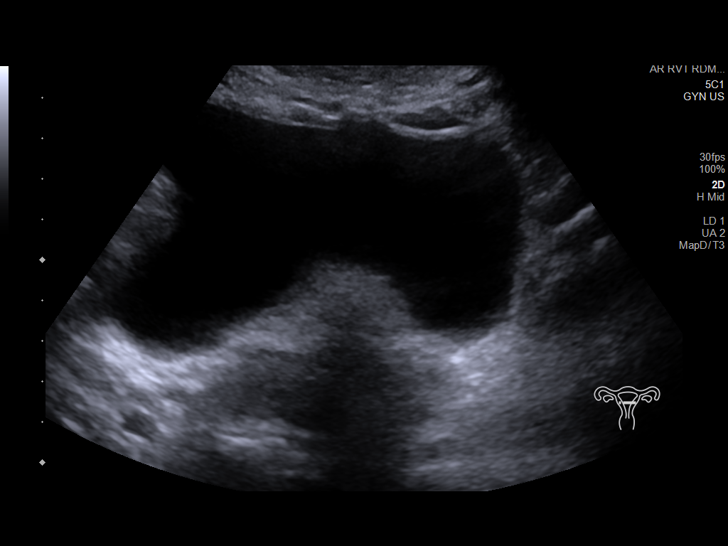

[13 of 25 positions shown; findings below may reference images not displayed]

FINDINGS: Uterus

Measurements: 10.1 x 5.9 x 4.6 cm = volume: 144 mL. Anteverted.
Heterogeneous myometrium with scattered areas of shadowing,
nonspecific but could reflect adenomyosis. No discrete uterine mass.
Nabothian cyst at cervix.

Endometrium

Thickness: 7 mm.  No endometrial fluid or focal abnormality

Right ovary

Measurements: 2.7 x 1.9 x 2.6 cm = volume: 6.8 mL. Normal morphology
without mass

Left ovary

Measurements: 2.5 x 1.5 x 2.6 cm = volume: 5.6 mL. Normal morphology
without mass

Other findings

No free pelvic fluid.  No adnexal masses.
IMPRESSION: Heterogeneous myometrium with scattered areas of shadowing,
nonspecific but could reflect adenomyosis.

Remainder of exam unremarkable.
